# Patient Record
Sex: Male | Born: 1975 | Hispanic: No | Marital: Married | Smoking: Never smoker
Health system: Southern US, Community
[De-identification: ages and names within clinical notes are randomized; demographics above are authoritative.]

---

## 2018-02-09 ENCOUNTER — Ambulatory Visit (INDEPENDENT_AMBULATORY_CARE_PROVIDER_SITE_OTHER): Payer: BLUE CROSS/BLUE SHIELD | Admitting: Orthopedic Surgery

## 2018-02-09 ENCOUNTER — Encounter (INDEPENDENT_AMBULATORY_CARE_PROVIDER_SITE_OTHER): Payer: Self-pay | Admitting: Orthopedic Surgery

## 2018-02-09 ENCOUNTER — Ambulatory Visit (INDEPENDENT_AMBULATORY_CARE_PROVIDER_SITE_OTHER): Payer: BLUE CROSS/BLUE SHIELD

## 2018-02-09 DIAGNOSIS — M25561 Pain in right knee: Secondary | ICD-10-CM

## 2018-02-11 ENCOUNTER — Encounter (INDEPENDENT_AMBULATORY_CARE_PROVIDER_SITE_OTHER): Payer: Self-pay | Admitting: Orthopedic Surgery

## 2018-02-11 NOTE — Progress Notes (Signed)
Office Visit Note   Patient: Tyler Weber           Date of Birth: [birth year]           MRN: [id number] Visit Date: 02/09/2018 Requested by: No referring provider defined for this encounter. PCP: No primary care provider on file.  Subjective: Chief Complaint  Patient presents with  . Right Shoulder - Pain    HPI: Patient presents for evaluation of right knee.  Injured his knee 3 days ago.  Noticed pain after jumping over a 4 foot fence.  This was essentially an eccentric loading force and he now localizes pain at the superior aspect of the patella.  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  Advil has not been helpful.  Denies any giving way but just  reports pain.              ROS: All systems reviewed are negative as they relate to the chief complaint within the history of present illness.  Patient denies  fevers or chills.   Assessment & Plan: Visit Diagnoses:  1. Acute pain of right knee     Plan: Impression is right knee pain with likely partial tearing of that quad tendon.  Had a little bit of fluid at the attachment site noticed on ultrasound.  No detachment no loss of strength no gapping in that region.  This should be a self-limited problem but he needs to be fairly careful with it over the next month.  I will see him back in 6 weeks for clinical recheck to make sure he is improving.  No indication for advanced imaging at this time.  Plain radiographs unremarkable.  Follow-Up Instructions: Return if symptoms worsen or fail to improve.   Orders:  Orders Placed This Encounter  Procedures  . XR KNEE 3 VIEW RIGHT   No orders of the defined types were placed in this encounter.     Procedures: No procedures performed   Clinical Data: No additional findings.  Objective: Vital Signs: There were no vitals taken for this visit.  Physical Exam:   Constitutional: Patient appears well-developed HEENT:  Head:  Normocephalic Eyes:EOM are normal Neck: Normal range of motion Cardiovascular: Normal rate Pulmonary/chest: Effort normal Neurologic: Patient is alert Skin: Skin is warm Psychiatric: Patient has normal mood and affect    Ortho Exam: Orthopedic exam demonstrates normal gait and alignment.  Excellent quad strength bilaterally.  Does have tenderness at the superior pole of the patella.  Negative patellar apprehension and no tenderness in the periretinacular tissues medially or laterally.  Extensor mechanism is fully intact and the patient has good leg extension with no lag on the right-hand side.  No joint line tenderness.  Collateral and cruciate ligaments are stable.  Pedal pulses palpable.  No other masses lymphadenopathy or skin changes or swelling noted in that right knee region.  Specialty Comments:  No specialty comments available.  Imaging: No results found.   PMFS History: There are no active problems to display for this patient.  History reviewed. No pertinent past medical history.  History reviewed. No pertinent family history.  History reviewed. No pertinent surgical history. Social History   Occupational History  . Not on file  Tobacco Use  . Smoking status: Not on file  Substance and Sexual Activity  . Alcohol use: Not on file  . Drug use: Not on file  . Sexual activity: Not on  file

## 2019-06-27 ENCOUNTER — Other Ambulatory Visit: Payer: Self-pay

## 2019-06-27 DIAGNOSIS — Z20822 Contact with and (suspected) exposure to covid-19: Secondary | ICD-10-CM

## 2019-06-28 LAB — NOVEL CORONAVIRUS, NAA: SARS-CoV-2, NAA: DETECTED — AB

## 2019-11-19 ENCOUNTER — Ambulatory Visit: Payer: Self-pay

## 2020-09-09 ENCOUNTER — Other Ambulatory Visit: Payer: Self-pay

## 2020-12-23 ENCOUNTER — Other Ambulatory Visit: Payer: Self-pay | Admitting: Physician Assistant

## 2020-12-23 DIAGNOSIS — R7401 Elevation of levels of liver transaminase levels: Secondary | ICD-10-CM

## 2020-12-24 ENCOUNTER — Other Ambulatory Visit: Payer: Self-pay

## 2020-12-24 ENCOUNTER — Ambulatory Visit: Payer: BC Managed Care – PPO | Admitting: Orthopedic Surgery

## 2020-12-24 ENCOUNTER — Ambulatory Visit: Payer: Self-pay

## 2020-12-24 DIAGNOSIS — M25561 Pain in right knee: Secondary | ICD-10-CM

## 2020-12-24 DIAGNOSIS — G8929 Other chronic pain: Secondary | ICD-10-CM

## 2020-12-26 ENCOUNTER — Encounter: Payer: Self-pay | Admitting: Orthopedic Surgery

## 2020-12-26 NOTE — Progress Notes (Signed)
Office Visit Note   Patient: Tyler Weber           Date of Birth: 12/15/1975           MRN: [id number] Visit Date: 12/24/2020 Requested by: No referring provider defined for this encounter. PCP: No primary care provider on file.  Subjective: Chief Complaint  Patient presents with  . Right Knee - Pain    HPI: Patient presents for evaluation of right knee pain.  Patient started 6 weeks ago.  He has had pain for many years but it flared up in earnest 6 weeks ago.  Describes swelling as well as mechanical symptoms.  He boxes for exercise.  Takes ibuprofen.  Localizes the pain primarily anteriorly.  Difficult for him to drive.  Pain is worse in the morning.  He does intermittent fasting so it is difficult for him to take anti-inflammatories.  He is leaving on a trip this coming Monday.              ROS: All systems reviewed are negative as they relate to the chief complaint within the history of present illness.  Patient denies  fevers or chills.   Assessment & Plan: Visit Diagnoses:  1. 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  He does have a lot of miles on the knee but not too much in terms of joint space narrowing.  I think it is possible that he has either patellofemoral chondral defect, early patellofemoral arthritis, and/or meniscal degeneration to account for the small effusion he has.  Symptoms have been refractory to nonoperative management including a home exercise program with quad strengthening as well as anti-inflammatories and activity modification.  Because of the acute exacerbation of chronic symptoms and the duration of the symptoms exceeding 6 weeks the plan at this time is for MRI scanning of the knee to evaluate for meniscal versus patellofemoral pathology.  Follow-up after that study  Follow-Up Instructions: No follow-ups on file.   Orders:  Orders  Placed This Encounter  Procedures  . XR KNEE 3 VIEW RIGHT  . MR Knee Right w/o contrast   No orders of the defined types were placed in this encounter.     Procedures: No procedures performed   Clinical Data: No additional findings.  Objective: Vital Signs: There were no vitals taken for this visit.  Physical Exam:   Constitutional: Patient appears well-developed HEENT:  Head: Normocephalic Eyes:EOM are normal Neck: Normal range of motion Cardiovascular: Normal rate Pulmonary/chest: Effort normal Neurologic: Patient is alert Skin: Skin is warm Psychiatric: Patient has normal mood and affect    Ortho Exam: Ortho exam demonstrates normal gait alignment.  Patient has excellent muscle tone in both legs with no atrophy.  Range of motion is full bilaterally.  No groin pain with internal or external rotation of the leg.  No masses lymphadenopathy or skin changes noted in the right leg region.  Collateral cruciate ligaments are intact in both knees.  Has no focal joint line tenderness but does have.  Retinacular tenderness.  McMurray compression testing is negative.  Specialty Comments:  No specialty comments available.  Imaging: No results found.   PMFS History: There are no problems to display for this patient.  No past medical history on file.  No family history on file.  No past surgical history on file. Social History  Occupational History  . Not on file  Tobacco Use  . Smoking status: Never Smoker  . Smokeless tobacco: Never Used  Substance and Sexual Activity  . Alcohol use: Not on file  . Drug use: Not on file  . Sexual activity: Not on file

## 2020-12-27 ENCOUNTER — Other Ambulatory Visit: Payer: BC Managed Care – PPO

## 2020-12-28 ENCOUNTER — Other Ambulatory Visit: Payer: BC Managed Care – PPO

## 2021-01-13 ENCOUNTER — Ambulatory Visit
Admission: RE | Admit: 2021-01-13 | Discharge: 2021-01-13 | Disposition: A | Discharge status: Home / Self Care | Payer: BC Managed Care – PPO | Source: Ambulatory Visit | Attending: Physician Assistant | Admitting: Physician Assistant

## 2021-01-13 DIAGNOSIS — R7401 Elevation of levels of liver transaminase levels: Secondary | ICD-10-CM

## 2022-09-26 IMAGING — US US ABDOMEN LIMITED RUQ/ASCITES
1 series · 14 of 25 positions shown · non-contrast
Comparison: No pertinent prior exams available for comparison.

CLINICAL DATA: Elevated liver transaminase level.

EXAM:
ULTRASOUND ABDOMEN LIMITED RIGHT UPPER QUADRANT

[Series 1: us abdomen limited ruq/ascites · 0.23mm/px · 14 of 42 slices shown]
[im 1/42]
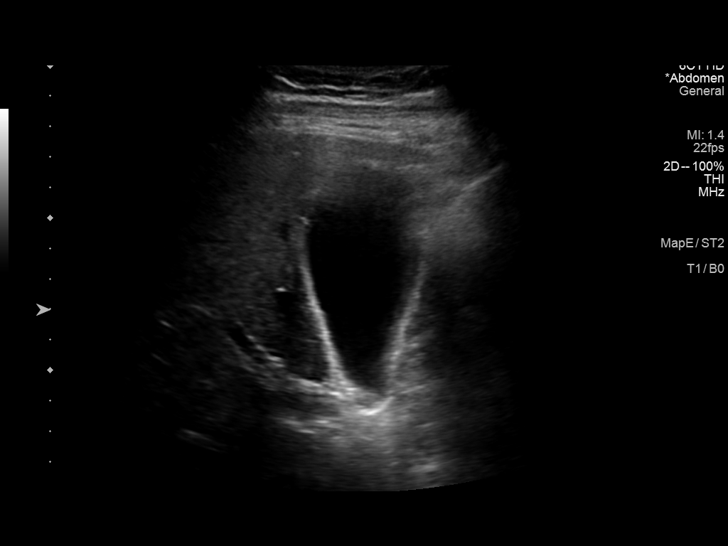
[im 4/42]
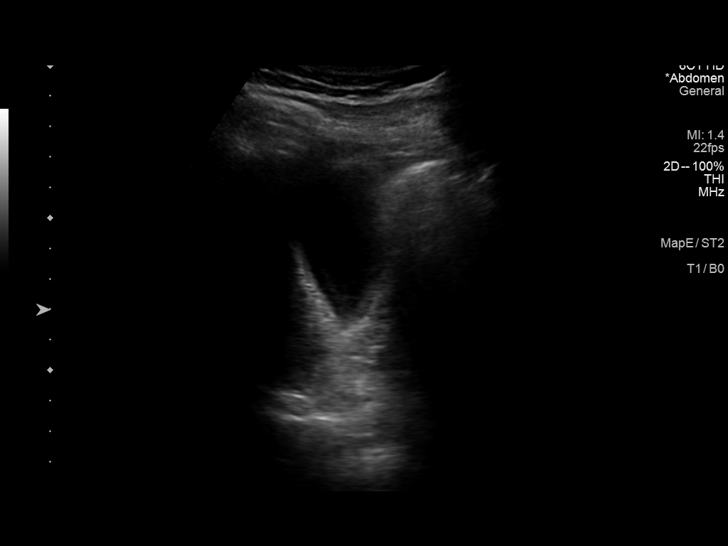
[im 7/42]
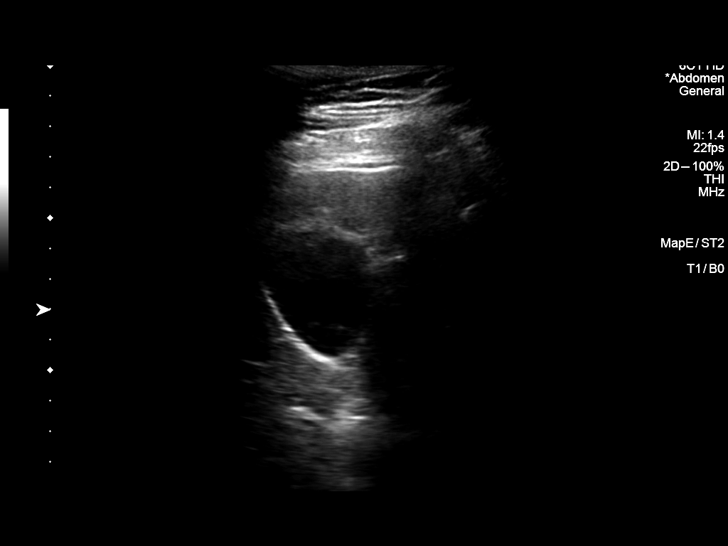
[im 11/42]
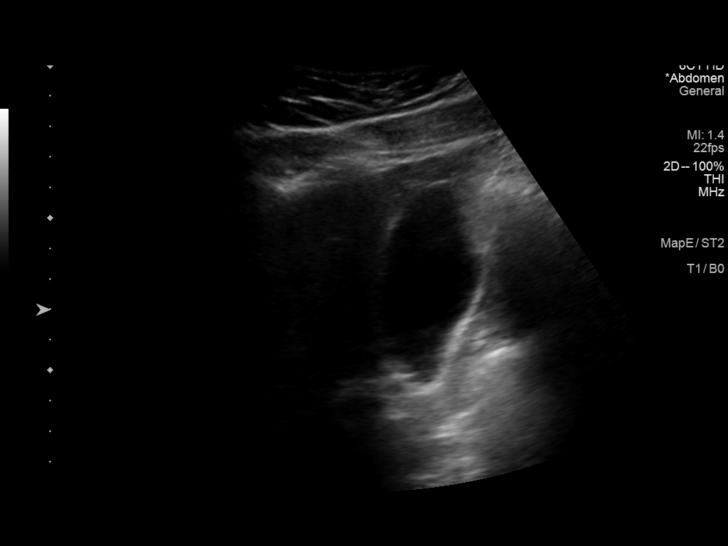
[im 14/42]
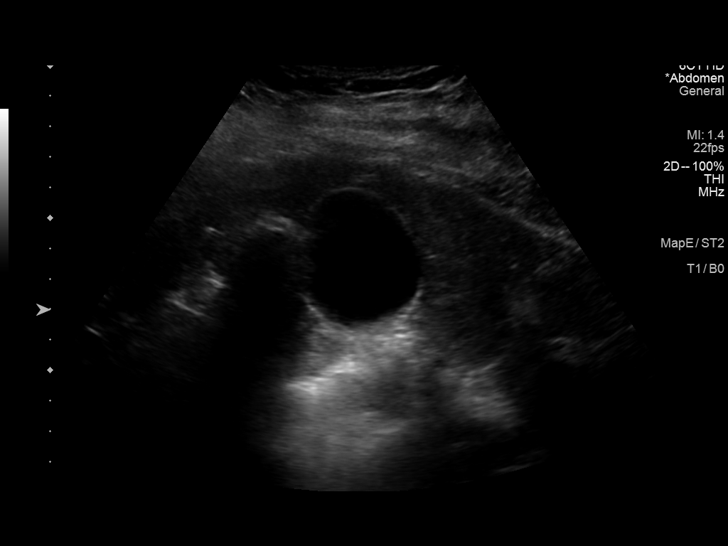
[im 16/42]
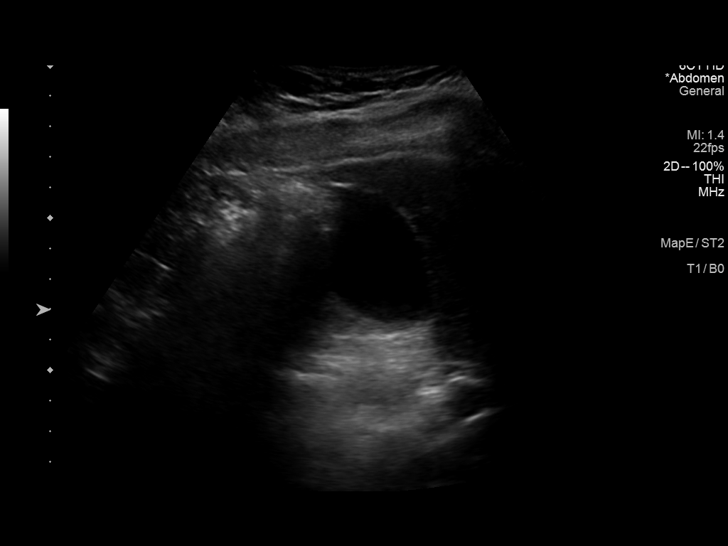
[im 19/42]
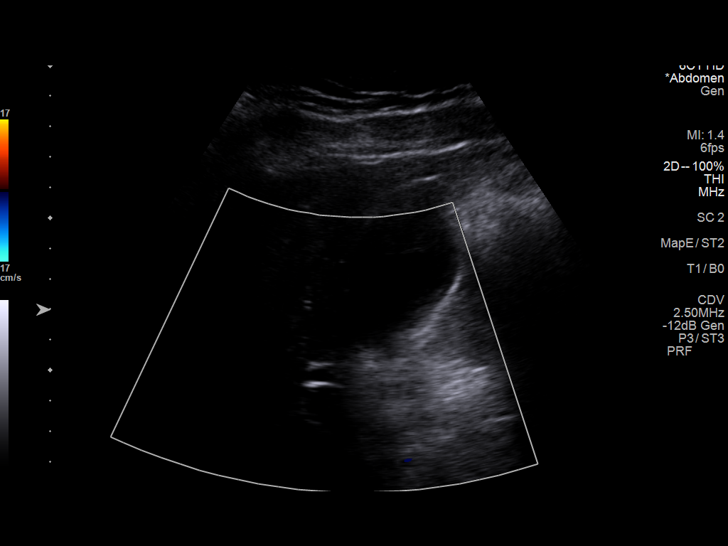
[im 23/42]
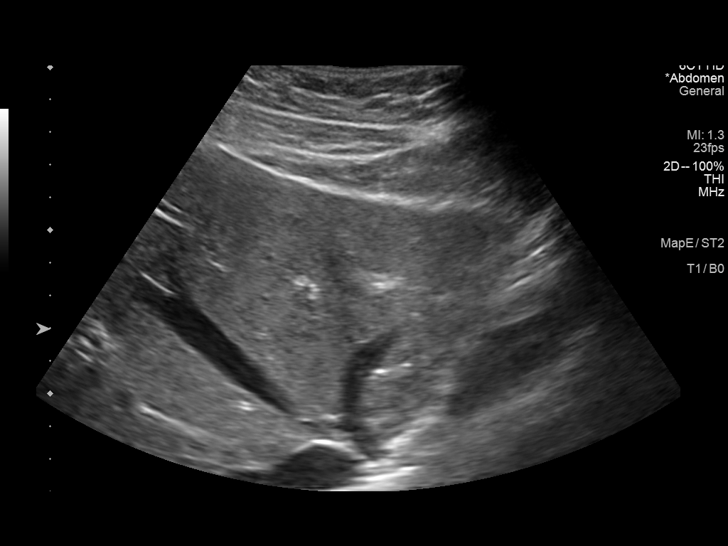
[im 26/42]
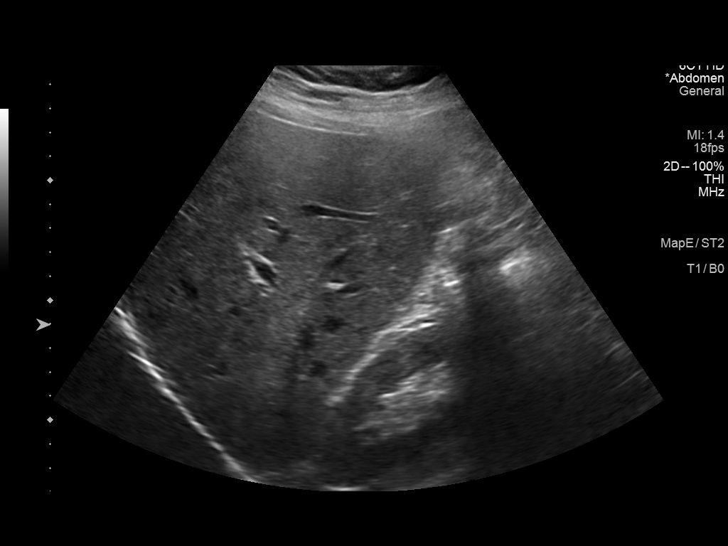
[im 28/42]
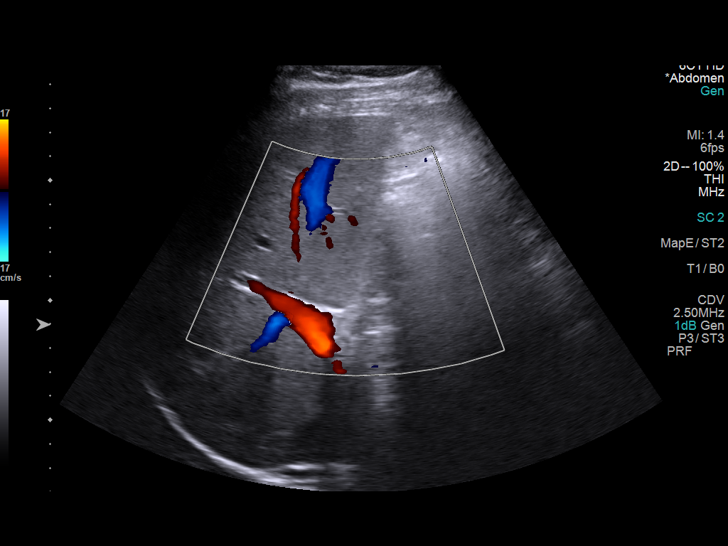
[im 31/42]
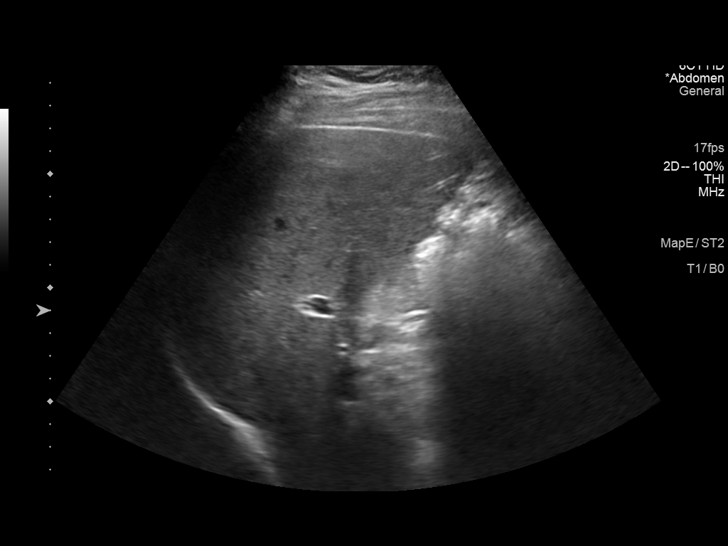
[im 35/42]
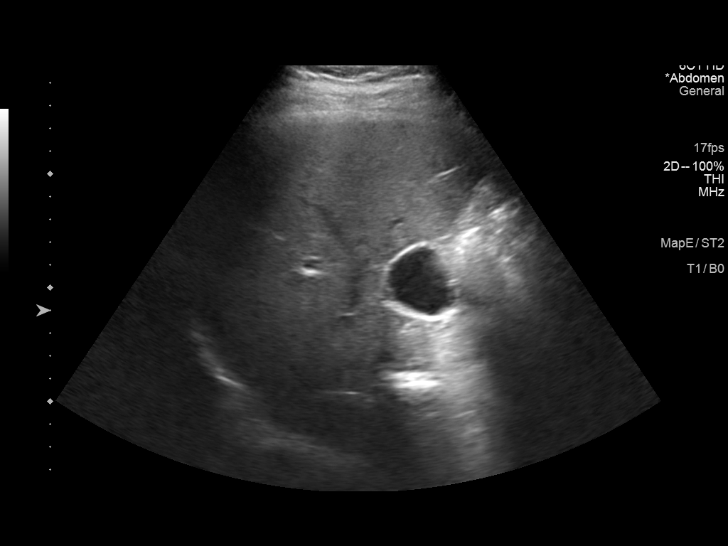
[im 38/42]
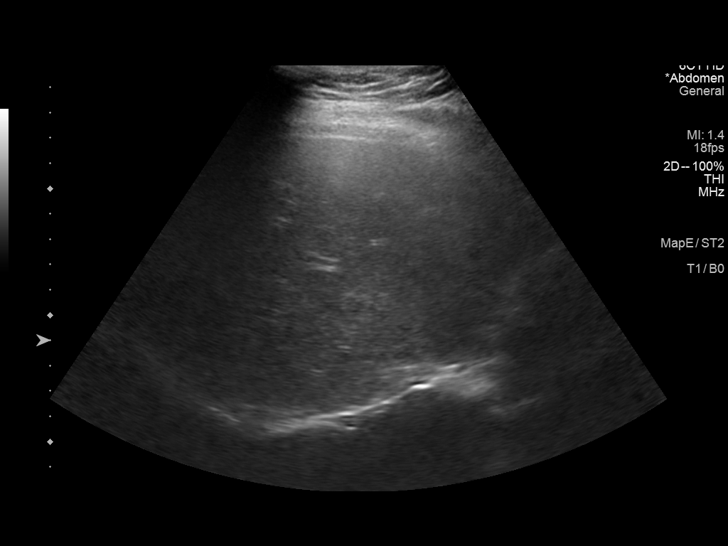
[im 42/42]
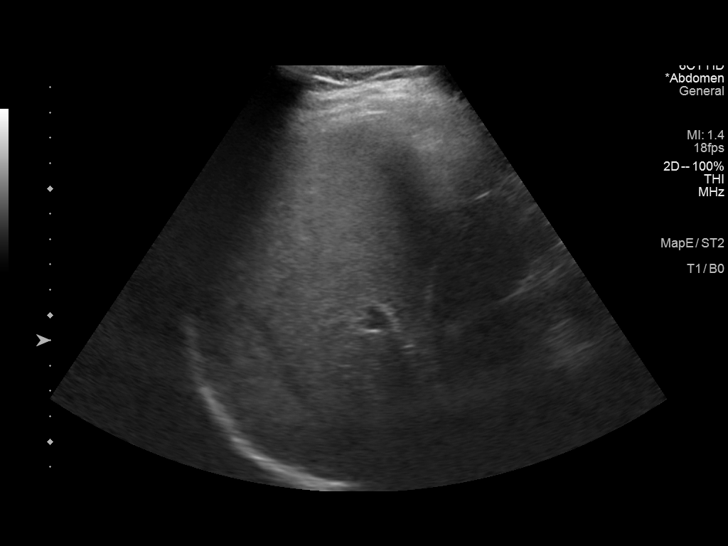

[14 of 25 positions shown; findings below may reference images not displayed]

FINDINGS: Gallbladder:

No gallstones or wall thickening visualized. No sonographic Murphy
sign noted by sonographer.

Common bile duct:

Diameter: 3 mm, within normal limits.

Liver:

No focal lesion identified. Within normal limits in parenchymal
echogenicity. Portal vein is patent on color Doppler imaging with
normal direction of blood flow towards the liver.
IMPRESSION: Unremarkable right upper quadrant ultrasound, as described.
